# Patient Record
Sex: Male | Born: 1955
Health system: Southern US, Community
[De-identification: ages and names within clinical notes are randomized; demographics above are authoritative.]

## PROBLEM LIST (undated history)

## (undated) DIAGNOSIS — R0681 Apnea, not elsewhere classified: Secondary | ICD-10-CM

## (undated) DIAGNOSIS — R3129 Other microscopic hematuria: Secondary | ICD-10-CM

## (undated) DIAGNOSIS — E785 Hyperlipidemia, unspecified: Secondary | ICD-10-CM

## (undated) DIAGNOSIS — B029 Zoster without complications: Secondary | ICD-10-CM

## (undated) HISTORY — DX: Hyperlipidemia, unspecified: E78.5

## (undated) HISTORY — PX: HERNIA REPAIR: SHX51

## (undated) HISTORY — PX: POSTERIOR LAMINECTOMY / DECOMPRESSION LUMBAR SPINE: SUR740

## (undated) HISTORY — DX: Zoster without complications: B02.9

## (undated) HISTORY — DX: Apnea, not elsewhere classified: R06.81

## (undated) HISTORY — PX: SKIN CANCER EXCISION: SHX779

## (undated) HISTORY — DX: Other microscopic hematuria: R31.29

## (undated) HISTORY — PX: LAMINECTOMY: SHX219

---

## 2005-10-06 ENCOUNTER — Encounter: Admission: RE | Admit: 2005-10-06 | Discharge: 2005-12-01 | Payer: Self-pay | Admitting: Nurse Practitioner

## 2008-04-01 ENCOUNTER — Emergency Department (HOSPITAL_COMMUNITY): Admission: EM | Admit: 2008-04-01 | Discharge: 2008-04-01 | Payer: Self-pay | Admitting: Emergency Medicine

## 2011-10-21 ENCOUNTER — Other Ambulatory Visit: Payer: Self-pay | Admitting: Family Medicine

## 2011-10-21 ENCOUNTER — Ambulatory Visit
Admission: RE | Admit: 2011-10-21 | Discharge: 2011-10-21 | Disposition: A | Discharge status: Home / Self Care | Payer: 59 | Source: Ambulatory Visit | Attending: Family Medicine | Admitting: Family Medicine

## 2011-10-21 DIAGNOSIS — M79602 Pain in left arm: Secondary | ICD-10-CM

## 2013-08-01 ENCOUNTER — Other Ambulatory Visit: Payer: Self-pay | Admitting: Orthopedic Surgery

## 2013-08-01 DIAGNOSIS — M79641 Pain in right hand: Secondary | ICD-10-CM

## 2013-08-09 ENCOUNTER — Ambulatory Visit
Admission: RE | Admit: 2013-08-09 | Discharge: 2013-08-09 | Disposition: A | Discharge status: Home / Self Care | Payer: 59 | Source: Ambulatory Visit | Attending: Orthopedic Surgery | Admitting: Orthopedic Surgery

## 2013-08-09 DIAGNOSIS — M79641 Pain in right hand: Secondary | ICD-10-CM

## 2013-11-15 ENCOUNTER — Other Ambulatory Visit (HOSPITAL_COMMUNITY): Payer: Self-pay | Admitting: Urology

## 2013-11-15 DIAGNOSIS — N2889 Other specified disorders of kidney and ureter: Secondary | ICD-10-CM

## 2013-11-29 ENCOUNTER — Ambulatory Visit (HOSPITAL_COMMUNITY)
Admission: RE | Admit: 2013-11-29 | Discharge: 2013-11-29 | Disposition: A | Discharge status: Home / Self Care | Payer: 59 | Source: Ambulatory Visit | Attending: Urology | Admitting: Urology

## 2014-03-05 ENCOUNTER — Other Ambulatory Visit (HOSPITAL_COMMUNITY): Payer: Self-pay | Admitting: Urology

## 2014-03-05 DIAGNOSIS — R52 Pain, unspecified: Secondary | ICD-10-CM

## 2014-03-14 ENCOUNTER — Ambulatory Visit (HOSPITAL_COMMUNITY)
Admission: RE | Admit: 2014-03-14 | Discharge: 2014-03-14 | Disposition: A | Discharge status: Home / Self Care | Payer: 59 | Source: Ambulatory Visit | Attending: Urology | Admitting: Urology

## 2014-03-14 ENCOUNTER — Ambulatory Visit (HOSPITAL_COMMUNITY): Payer: 59

## 2014-03-14 DIAGNOSIS — N281 Cyst of kidney, acquired: Secondary | ICD-10-CM | POA: Diagnosis present

## 2014-03-14 DIAGNOSIS — R52 Pain, unspecified: Secondary | ICD-10-CM

## 2014-03-14 MED ORDER — GADOBENATE DIMEGLUMINE 529 MG/ML IV SOLN
20.0000 mL | Freq: Once | INTRAVENOUS | Status: AC | PRN
  Administered 2014-03-14: 20 mL via INTRAVENOUS

## 2014-07-23 ENCOUNTER — Encounter: Payer: Self-pay | Admitting: Neurology

## 2014-07-23 ENCOUNTER — Ambulatory Visit (INDEPENDENT_AMBULATORY_CARE_PROVIDER_SITE_OTHER): Payer: BLUE CROSS/BLUE SHIELD | Admitting: Neurology

## 2014-07-23 VITALS — BP 128/82 | HR 68 | Resp 18 | Ht 76.0 in | Wt 272.0 lb

## 2014-07-23 DIAGNOSIS — G4733 Obstructive sleep apnea (adult) (pediatric): Secondary | ICD-10-CM

## 2014-07-23 DIAGNOSIS — E669 Obesity, unspecified: Secondary | ICD-10-CM

## 2014-07-23 DIAGNOSIS — G4719 Other hypersomnia: Secondary | ICD-10-CM | POA: Diagnosis not present

## 2014-07-23 DIAGNOSIS — R351 Nocturia: Secondary | ICD-10-CM | POA: Diagnosis not present

## 2014-07-23 DIAGNOSIS — R519 Headache, unspecified: Secondary | ICD-10-CM

## 2014-07-23 DIAGNOSIS — R51 Headache: Secondary | ICD-10-CM

## 2014-07-23 NOTE — Progress Notes (Signed)
Subjective:    Patient ID: Jeffrey Morris is a 59 y.o. male.  HPI      Dear Dr. Link SnufferHolwerda,   I saw your patient, Jeffrey Morris, upon your kind request in my neurologic clinic today for initial consultation of his sleep disorder, in particular, concern for underlying obstructive sleep apnea. The patient is unaccompanied today. As you know, Jeffrey Morris is a 59 year old right-handed gentleman with an underlying medical history of hyperlipidemia, and obesity, who reports snoring and witnessed apneic pauses while he is asleep per wife. He does report excessive daytime somnolence. His Epworth sleepiness score is 12 out of 24 at his fatigue score is 35 today. He does report nocturia usually once per night. He has occasional morning headaches. Bedtime varies but typically is between 10 and 11 PM. His rise time is 6 to 6:30 AM. In the past couple months his symptoms have become worse. He works at the computer every day. He does not have any on call or weekend work. He drinks quite a bit of caffeine in the form of sodas about 3-4 cans per day and 2 cups of coffee. He drinks alcohol rarely. He is a nonsmoker. He denies any parasomnias. He has woken himself up with his snoring and gasping sensations while asleep. He's not aware of any obstructive sleep apnea in the family but his father snored. His father died from heart disease and stroke. Mother is 58 years old. He lives at home with his wife and 636 year old daughter.  His Past Medical History Is Significant For: Past Medical History  Diagnosis Date  . Shingles   . Microscopic hematuria   . Apneic episode   . Hyperlipemia     His Past Surgical History Is Significant For: Past Surgical History  Procedure Laterality Date  . Hernia repair    . Laminectomy    . Posterior laminectomy / decompression lumbar spine    . Skin cancer excision      His Family History Is Significant For: Family History  Problem Relation Age of Onset  . Diabetes Father    . Stroke Father   . Heart disease Father   . Cancer Paternal Aunt   . Diabetes Paternal Grandfather     His Social History Is Significant For: History   Social History  . Marital Status: Married    Spouse Name: N/A  . Number of Children: 1  . Years of Education: [employer]   Social History Main Topics  . Smoking status: Never Smoker   . Smokeless tobacco: Not on file  . Alcohol Use: 0.0 oz/week    0 Standard drinks or equivalent per week     Comment: Rare  . Drug Use: No  . Sexual Activity: Not on file   Other Topics Concern  . None   Social History Narrative   60oz of caffeine a day     His Allergies Are:  No Known Allergies:   His Current Medications Are:  Outpatient Encounter Prescriptions as of 07/23/2014  Medication Sig  . aspirin 81 MG tablet Take 81 mg by mouth daily.  Marland Kitchen. atorvastatin (LIPITOR) 10 MG tablet Take 10 mg by mouth daily.   No facility-administered encounter medications on file as of 07/23/2014.  :  Review of Systems:  Out of a complete 14 point review of systems, all are reviewed and negative with the exception of these symptoms as listed below:   Review of Systems  Neurological:       Snoring, Sometimes wakes  up during the night, Witnessed apnea, Sometimes wakes up in the morning tired, denies taking naps during the day.     Objective:  Neurologic Exam  Physical Exam Physical Examination:   Filed Vitals:   07/23/14 1350  BP: 128/82  Pulse: 68  Resp: 18   General Examination: The patient is a very pleasant 59 y.o. male in no acute distress. He appears well-developed and well-nourished and well groomed.   HEENT: Normocephalic, atraumatic, pupils are equal, round and reactive to light and accommodation. Funduscopic exam is normal with sharp disc margins noted. Extraocular tracking is good without limitation to gaze excursion or nystagmus noted. Normal smooth pursuit is noted. Hearing is grossly intact. Tympanic membranes are clear  bilaterally. Face is symmetric with normal facial animation and normal facial sensation. Speech is clear with no dysarthria noted. There is no hypophonia. There is no lip, neck/head, jaw or voice tremor. Neck is supple with full range of passive and active motion. There are no carotid bruits on auscultation. Oropharynx exam reveals: mild mouth dryness, adequate dental hygiene and mild airway crowding, due to redundant soft palate and smaller airway entry. Mallampati is class II. Tongue protrudes centrally and palate elevates symmetrically. Tonsils are small b/l. Neck size is 16.75 inches. He has a Mild overbite. Nasal inspection reveals no significant nasal mucosal bogginess or redness, but signs of recent R nosebleed and septal deviation to the R.   Chest: Clear to auscultation without wheezing, rhonchi or crackles noted.  Heart: S1+S2+0, regular and normal without murmurs, rubs or gallops noted.   Abdomen: Soft, non-tender and non-distended with normal bowel sounds appreciated on auscultation.  Extremities: There is no pitting edema in the distal lower extremities bilaterally. Pedal pulses are intact.  Skin: Warm and dry without trophic changes noted. There are no varicose veins.  Musculoskeletal: exam reveals no obvious joint deformities, tenderness or joint swelling or erythema.   Neurologically:  Mental status: The patient is awake, alert and oriented in all 4 spheres. His immediate and remote memory, attention, language skills and fund of knowledge are appropriate. There is no evidence of aphasia, agnosia, apraxia or anomia. Speech is clear with normal prosody and enunciation. Thought process is linear. Mood is normal and affect is normal.  Cranial nerves II - XII are as described above under HEENT exam. In addition: shoulder shrug is normal with equal shoulder height noted. Motor exam: Normal bulk, strength and tone is noted. There is no drift, tremor or rebound. Romberg is negative. Reflexes  are 2+ throughout. Babinski: Toes are flexor bilaterally. Fine motor skills and coordination: intact with normal finger taps, normal hand movements, normal rapid alternating patting, normal foot taps and normal foot agility.  Cerebellar testing: No dysmetria or intention tremor on finger to nose testing. Heel to shin is unremarkable bilaterally. There is no truncal or gait ataxia.  Sensory exam: intact to light touch, pinprick, vibration, temperature sense in the upper and lower extremities.  Gait, station and balance: He stands easily. No veering to one side is noted. No leaning to one side is noted. Posture is age-appropriate and stance is narrow based. Gait shows normal stride length and normal pace. No problems turning are noted. He turns en bloc. Tandem walk is unremarkable.   Assessment and Plan:  In summary, Jeffrey NipWallace Morris is a very pleasant 59 y.o.-year old male with an underlying medical history of hyperlipidemia, and obesity, whose history and physical exam are in keeping with obstructive sleep apnea (OSA). I had a  long chat with the patient about my findings and the diagnosis of OSA, its prognosis and treatment options. We talked about medical treatments, surgical interventions and non-pharmacological approaches. I explained in particular the risks and ramifications of untreated moderate to severe OSA, especially with respect to developing cardiovascular disease down the Road, including congestive heart failure, difficult to treat hypertension, cardiac arrhythmias, or stroke. Even type 2 diabetes has, in part, been linked to untreated OSA. Symptoms of untreated OSA include daytime sleepiness, memory problems, mood irritability and mood disorder such as depression and anxiety, lack of energy, as well as recurrent headaches, especially morning headaches. We talked about trying to maintain a healthy lifestyle in general, as well as the importance of weight control. I encouraged the patient to eat  healthy, exercise daily and keep well hydrated, to keep a scheduled bedtime and wake time routine, to not skip any meals and eat healthy snacks in between meals. I advised the patient not to drive when feeling sleepy. He is advised to try to reduce his caffeine intake.  I recommended the following at this time: sleep study with potential positive airway pressure titration. (We will score hypopneas at 3% and split the sleep study into diagnostic and treatment portion, if the estimated. 2 hour AHI is >15/h).   I explained the sleep test procedure to the patient and also outlined possible surgical and non-surgical treatment options of OSA, including the use of a custom-made dental device (which would require a referral to a specialist dentist or oral surgeon), upper airway surgical options, such as pillar implants, radiofrequency surgery, tongue base surgery, and UPPP (which would involve a referral to an ENT surgeon). Rarely, jaw surgery such as mandibular advancement may be considered.  I also explained the CPAP treatment option to the patient, who indicated that he would be willing to try CPAP if the need arises. I explained the importance of being compliant with PAP treatment, not only for insurance purposes but primarily to improve His symptoms, and for the patient's long term health benefit, including to reduce His cardiovascular risks. I answered all his questions today and the patient was in agreement. I would like to see him back after the sleep study is completed and encouraged him to call with any interim questions, concerns, problems or updates.   Thank you very much for allowing me to participate in the care of this nice patient. If I can be of any further assistance to you please do not hesitate to call me at [phone].  Sincerely,   Huston Foley, MD, PhD  .

## 2014-07-23 NOTE — Patient Instructions (Addendum)

## 2014-08-14 ENCOUNTER — Ambulatory Visit (INDEPENDENT_AMBULATORY_CARE_PROVIDER_SITE_OTHER): Payer: BLUE CROSS/BLUE SHIELD | Admitting: Neurology

## 2014-08-14 VITALS — BP 137/81

## 2014-08-14 DIAGNOSIS — G4733 Obstructive sleep apnea (adult) (pediatric): Secondary | ICD-10-CM | POA: Diagnosis not present

## 2014-08-14 DIAGNOSIS — G4730 Sleep apnea, unspecified: Secondary | ICD-10-CM

## 2014-08-14 DIAGNOSIS — G479 Sleep disorder, unspecified: Secondary | ICD-10-CM

## 2014-08-14 DIAGNOSIS — G4710 Hypersomnia, unspecified: Secondary | ICD-10-CM

## 2014-08-14 DIAGNOSIS — G4761 Periodic limb movement disorder: Secondary | ICD-10-CM

## 2014-08-14 NOTE — Sleep Study (Signed)
Please see the scanned sleep study interpretation located in the Procedure tab within the Chart Review section. 

## 2014-08-20 ENCOUNTER — Telehealth: Payer: Self-pay | Admitting: Neurology

## 2014-08-20 DIAGNOSIS — G4733 Obstructive sleep apnea (adult) (pediatric): Secondary | ICD-10-CM

## 2014-08-20 NOTE — Telephone Encounter (Signed)
Patient seen on 07/23/14, split night sleep study on 08/14/14.  Ins: [employer] [place]:   Please call and notify patient that the recent sleep study confirmed the diagnosis of moderate OSA. He did well with CPAP during the study with significant improvement of the respiratory events. Therefore, I would like start the patient on CPAP therapy at home by prescribing a machine for home use. I placed the order in the chart. The patient will need a follow up appointment with me in 8 to 10 weeks post set up that has to be scheduled; please go ahead and schedule while you have the patient on the phone and make sure patient understands the importance of keeping this window for the FU appointment, as it is often an insurance requirement and failing to adhere to this may result in losing coverage for sleep apnea treatment. 15 min follow-up should suffice, unless there is a 30 min FU slot available.  Please re-enforce the importance of compliance with treatment and the need for us to monitor compliance data - again an insurance requirement and good feedback for the patient as far as how they are doing.  Also remind patient, that any upcoming CPAP machine or mask issues, should be first addressed with the DME company. Please ask if patient has a preference regarding DME company.  Please arrange for CPAP set up at home through a DME company of patient's choice - once you have spoken to the patient - and faxed/routed report to PCP and referring MD (if other than PCP), you can close this encounter, thanks,   Huston FoleySaima Loghan Kurtzman, MD, PhD [facility] ([facility])

## 2014-08-21 NOTE — Telephone Encounter (Signed)
Left message to call back for sleep study results.  

## 2014-08-21 NOTE — Telephone Encounter (Signed)
Faxed to PCP today.

## 2014-08-24 NOTE — Telephone Encounter (Signed)
Left message to call us back for sleep study results.

## 2014-09-03 NOTE — Telephone Encounter (Signed)
I spoke to patient today. He is aware of PSG results and would like to start CPAP at home. He has Acupuncturist and lives in [place]. He was not able to make f/u appt today but will call back. I will send patient a letter to remind him to make appt and notify him of the importance of compliance.

## 2014-09-05 NOTE — Telephone Encounter (Signed)
Referral has been sent to [facility] in [place].

## 2014-11-21 ENCOUNTER — Ambulatory Visit (INDEPENDENT_AMBULATORY_CARE_PROVIDER_SITE_OTHER): Payer: BLUE CROSS/BLUE SHIELD | Admitting: Neurology

## 2014-11-21 ENCOUNTER — Encounter: Payer: Self-pay | Admitting: Neurology

## 2014-11-21 VITALS — BP 118/72 | HR 62 | Resp 18 | Ht 76.0 in | Wt 277.0 lb

## 2014-11-21 DIAGNOSIS — G4733 Obstructive sleep apnea (adult) (pediatric): Secondary | ICD-10-CM | POA: Diagnosis not present

## 2014-11-21 DIAGNOSIS — Z9989 Dependence on other enabling machines and devices: Principal | ICD-10-CM

## 2014-11-21 NOTE — Progress Notes (Signed)
Subjective:    Patient ID: Jeffrey Morris is a 59 y.o. male.  HPI     Interim history:   Jeffrey Morris is a 59 year old right-handed gentleman with an underlying medical history of hyperlipidemia, and obesity, who presents for follow-up consultation of his obstructive sleep apnea, after his sleep study. The patient is unaccompanied today. I first met him on 07/23/2014 at the request of his primary care physician, at which time the patient reported snoring and witnessed apneic pauses while asleep per wife. I invited him back for sleep study. He had a split-night sleep study on 08/14/2014 and went over his test results with him in detail today. His baseline sleep efficiency was 50.1% with a latency to sleep of 82.5 minutes and wake after sleep onset of 29.5 minutes with mild sleep fragmentation noted. He had an elevated arousal index. He had an increased percentage of stage II sleep, absence of slow-wave sleep and REM sleep at 15.6% with a normal REM latency. He had no significant EKG, or EEG changes. He had no significant PLMS. He had mild to moderate snoring. He had 51 obstructive hypopneas. His AHI was 27.2 per hour, baseline oxygen saturation 91%, nadir was 84%. He was then titrated with CPAP. Sleep efficiency was 81.7%. Latency to sleep of 25.5 minutes and wake after sleep onset of 14.5 minutes with mild sleep fragmentation noted. He had a normal arousal index. He had an increased percentage of REM sleep at 38.3%. Average oxygen saturation was 93%, nadir was 89%. Moderate PLMS were noted at 47.2 per hour with an associated arousal index of only 1.3 per hour. He was titrated from 5 cm to 11 cm. On the final pressure his AHI was 0 per hour with supine REM sleep achieved. Based on the test results are prescribed CPAP therapy for home use at a pressure of 11 cm.  Today, 11/21/2014: I reviewed his CPAP compliance data from 10/21/2014 through 11/19/2014 which is a total of 30 days during which time he used his  machine 29 days with percent used days greater than 4 hours at 80%, indicating very good compliance with an average usage of 5 hours and 13 minutes, residual AHI low at 0.9 per hour, leak at times high with the 95th percentile at 30.9 L/m on a pressure of 11 with EPR of 2.  Today, 11/21/2014: He reports feeling about the same, but nocturia may be a little better. His snoring has definitely improved and his daytime somnolence is a little better. He does not necessarily allow for enough sleep time as he averages about 5-5 1/2 hours of sleep on an average night. He is tolerating the pressure in the mask. He has noticed air leaking from the mask. He likes to sleep on his sides. He is using a fullface mask because of mouth opening and he could not use a nasal mask as originally prescribed. He has no new complaints.  Previously:  07/23/2014: He does report excessive daytime somnolence. His Epworth sleepiness score is 12 out of 24 at his fatigue score is 35 today. He does report nocturia usually once per night. He has occasional morning headaches. Bedtime varies but typically is between 10 and 11 PM. His rise time is 6 to 6:30 AM. In the past couple months his symptoms have become worse. He works at the computer every day. He does not have any on call or weekend work. He drinks quite a bit of caffeine in the form of sodas about 3-4 cans per  day and 2 cups of coffee. He drinks alcohol rarely. He is a nonsmoker. He denies any parasomnias. He has woken himself up with his snoring and gasping sensations while asleep. He's not aware of any obstructive sleep apnea in the family but his father snored. His father died from heart disease and stroke. Mother is 68 years old. He lives at home with his wife and 2 year old daughter.  His Past Medical History Is Significant For: Past Medical History  Diagnosis Date  . Shingles   . Microscopic hematuria   . Apneic episode   . Hyperlipemia     His Past Surgical History Is  Significant For: Past Surgical History  Procedure Laterality Date  . Hernia repair    . Laminectomy    . Posterior laminectomy / decompression lumbar spine    . Skin cancer excision      His Family History Is Significant For: Family History  Problem Relation Age of Onset  . Diabetes Father   . Stroke Father   . Heart disease Father   . Cancer Paternal Aunt   . Diabetes Paternal Grandfather     His Social History Is Significant For: Social History   Social History  . Marital Status: Married    Spouse Name: N/A  . Number of Children: 1  . Years of Education: [employer]   Social History Main Topics  . Smoking status: Never Smoker   . Smokeless tobacco: None  . Alcohol Use: 0.0 oz/week    0 Standard drinks or equivalent per week     Comment: Rare  . Drug Use: No  . Sexual Activity: Not Asked   Other Topics Concern  . None   Social History Narrative   60oz of caffeine a day     His Allergies Are:  No Known Allergies:   His Current Medications Are:  Outpatient Encounter Prescriptions as of 11/21/2014  Medication Sig  . aspirin 81 MG tablet Take 81 mg by mouth daily.  Marland Kitchen atorvastatin (LIPITOR) 10 MG tablet Take 10 mg by mouth daily.   No facility-administered encounter medications on file as of 11/21/2014.  :  Review of Systems:  Out of a complete 14 point review of systems, all are reviewed and negative with the exception of these symptoms as listed below:  Review of Systems  Neurological:       Patient feels that he is doing well with CPAP machine. Reports some trouble with sleeping on his side and breaking the seal.     Objective:  Neurologic Exam  Physical Exam Physical Examination:   Filed Vitals:   11/21/14 1614  BP: 118/72  Pulse: 62  Resp: 18   General Examination: The patient is a very pleasant 59 y.o. male in no acute distress. He appears well-developed and well-nourished and well groomed.   HEENT: Normocephalic, atraumatic, pupils are equal,  round and reactive to light and accommodation. Funduscopic exam is normal with sharp disc margins noted. Extraocular tracking is good without limitation to gaze excursion or nystagmus noted. Normal smooth pursuit is noted. Hearing is grossly intact. Face is symmetric with normal facial animation and normal facial sensation. Speech is clear with no dysarthria noted. There is no hypophonia. There is no lip, neck/head, jaw or voice tremor. Neck is supple with full range of passive and active motion. There are no carotid bruits on auscultation. Oropharynx exam reveals: mild mouth dryness, adequate dental hygiene and mild airway crowding, due to redundant soft palate and smaller airway entry.  Mallampati is class II. Tongue protrudes centrally and palate elevates symmetrically. Tonsils are small b/l.   Chest: Clear to auscultation without wheezing, rhonchi or crackles noted.  Heart: S1+S2+0, regular and normal without murmurs, rubs or gallops noted.   Abdomen: Soft, non-tender and non-distended with normal bowel sounds appreciated on auscultation.  Extremities: There is no pitting edema in the distal lower extremities bilaterally. Pedal pulses are intact.  Skin: Warm and dry without trophic changes noted. There are no varicose veins.  Musculoskeletal: exam reveals no obvious joint deformities, tenderness or joint swelling or erythema.   Neurologically:  Mental status: The patient is awake, alert and oriented in all 4 spheres. His immediate and remote memory, attention, language skills and fund of knowledge are appropriate. There is no evidence of aphasia, agnosia, apraxia or anomia. Speech is clear with normal prosody and enunciation. Thought process is linear. Mood is normal and affect is normal.  Cranial nerves II - XII are as described above under HEENT exam. In addition: shoulder shrug is normal with equal shoulder height noted. Motor exam: Normal bulk, strength and tone is noted. There is no drift,  tremor or rebound. Romberg is negative. Reflexes are 2+ throughout. Fine motor skills and coordination: intact with normal finger taps, normal hand movements, normal rapid alternating patting, normal foot taps and normal foot agility.  Cerebellar testing: No dysmetria or intention tremor on finger to nose testing. Heel to shin is unremarkable bilaterally. There is no truncal or gait ataxia.  Sensory exam: intact to light touch in the upper and lower extremities.  Gait, station and balance: He stands easily. No veering to one side is noted. No leaning to one side is noted. Posture is age-appropriate and stance is narrow based. Gait shows normal stride length and normal pace. No problems turning are noted. He turns en bloc. Tandem walk is unremarkable.   Assessment and Plan:  In summary, Majd Tissue is a very pleasant 59 year old male with an underlying medical history of hyperlipidemia, and obesity, who presents for follow-up consultation of his recent diagnosis of obstructive sleep apnea, now established on CPAP therapy. He had a split-night sleep study on 08/14/2014 which talked about his test results in detail today as well as his compliance data. He is commended for his treatment adherence. He reports slight improvements in his sleep including improvement in his nocturia but is reminded to allow for more sleep time. In addition, he drinks a lot of caffeine and his last caffeine containing beverage is as late as 10 or 11 PM at times. He is advised to increase his water intake and reduce his caffeine intake. I advised him regarding good sleep hygiene. I again explained the diagnosis of OSA, its prognosis and treatment options. We talked about medical treatments, surgical interventions and non-pharmacological approaches. I explained in particular the risks and ramifications of untreated moderate to severe OSA, especially with respect to developing cardiovascular disease down the Road, including congestive  heart failure, difficult to treat hypertension, cardiac arrhythmias, or stroke. Even type 2 diabetes has, in part, been linked to untreated OSA. Symptoms of untreated OSA include daytime sleepiness, memory problems, mood irritability and mood disorder such as depression and anxiety, lack of energy, as well as recurrent headaches, especially morning headaches.  We talked about trying to maintain a healthy lifestyle in general, as well as the importance of weight control. I encouraged the patient to continue to be compliant with CPAP therapy, not only for insurance purposes but primarily to  improve His symptoms, and for the patient's long term health benefit, including to reduce His cardiovascular risks. I would like to see him back in 6 months, sooner if the need arises. His physical exam is stable. I answered all his questions today and the patient was in agreement. I spent 20 minutes in total face-to-face time with the patient, more than 50% of which was spent in counseling and coordination of care, reviewing test results, reviewing medication and discussing or reviewing the diagnosis of OSA, its prognosis and treatment options.

## 2014-11-21 NOTE — Patient Instructions (Signed)
Please continue using your CPAP regularly. While your insurance requires that you use CPAP at least 4 hours each night on 70% of the nights, I recommend, that you not skip any nights and use it throughout the night if you can. Getting used to CPAP and staying with the treatment long term does take time and patience and discipline. Untreated obstructive sleep apnea when it is moderate to severe can have an adverse impact on cardiovascular health and raise her risk for heart disease, arrhythmias, hypertension, congestive heart failure, stroke and diabetes. Untreated obstructive sleep apnea causes sleep disruption, nonrestorative sleep, and sleep deprivation. This can have an impact on your day to day functioning and cause daytime sleepiness and impairment of cognitive function, memory loss, mood disturbance, and problems focussing. Using CPAP regularly can improve these symptoms. Keep up the good work! I will see you back in 6 months for sleep apnea check up, and if you continue to do well on CPAP I will see you once a year thereafter.   Drink more water, less caffeine.   Please remember to try to maintain good sleep hygiene, which means: Keep a regular sleep and wake schedule, try not to exercise or have a meal within 2 hours of your bedtime, try to keep your bedroom conducive for sleep, that is, cool and dark, without light distractors such as an illuminated alarm clock, and refrain from watching TV right before sleep or in the middle of the night and do not keep the TV or radio on during the night. Also, try not to use or play on electronic devices at bedtime, such as your cell phone, tablet PC or laptop. If you like to read at bedtime on an electronic device, try to dim the background light as much as possible. Do not eat in the middle of the night.

## 2015-05-22 ENCOUNTER — Ambulatory Visit: Payer: BLUE CROSS/BLUE SHIELD | Admitting: Neurology

## 2015-12-03 ENCOUNTER — Ambulatory Visit (INDEPENDENT_AMBULATORY_CARE_PROVIDER_SITE_OTHER): Payer: Managed Care, Other (non HMO)

## 2015-12-03 ENCOUNTER — Ambulatory Visit (INDEPENDENT_AMBULATORY_CARE_PROVIDER_SITE_OTHER): Payer: Managed Care, Other (non HMO) | Admitting: Podiatry

## 2015-12-03 ENCOUNTER — Encounter: Payer: Self-pay | Admitting: Podiatry

## 2015-12-03 VITALS — BP 155/94 | HR 58 | Resp 16

## 2015-12-03 DIAGNOSIS — M79672 Pain in left foot: Secondary | ICD-10-CM | POA: Diagnosis not present

## 2015-12-03 DIAGNOSIS — M7752 Other enthesopathy of left foot: Secondary | ICD-10-CM

## 2015-12-03 DIAGNOSIS — M778 Other enthesopathies, not elsewhere classified: Secondary | ICD-10-CM

## 2015-12-03 DIAGNOSIS — M779 Enthesopathy, unspecified: Principal | ICD-10-CM

## 2015-12-03 MED ORDER — METHYLPREDNISOLONE 4 MG PO TBPK: ORAL_TABLET | ORAL | 0 refills | Status: AC

## 2015-12-03 MED ORDER — MELOXICAM 15 MG PO TABS: 15.0000 mg | ORAL_TABLET | Freq: Every day | ORAL | 3 refills | Status: AC

## 2015-12-03 NOTE — Progress Notes (Signed)
   Subjective:    Patient ID: Jeffrey Morris, male    DOB: 08/31/1955, 60 y.o.   MRN: [id number]  HPI: He presents today with a chief complaint of a painful forefoot left. He states that is beneath the second and third toes. He states it has been aching now for the past 2-3 months with burning in the toes at times. He states that it feels swollen and like there is something underneath his toes that he is walking on. He states that he seems to be worsening rather than improving. He denies any trauma.    Review of Systems  All other systems reviewed and are negative.      Objective:   Physical Exam: Vital signs are stable alert and oriented 3. Pulses are palpable. Neurologic sensorium is intact per Semmes-Weinstein monofilament. Deep tendon reflexes are intact. Muscle strength is 5 over 5 dorsiflexion plantar flexors and inverters everters all intrinsic musculature is intact. Orthopedic evaluation does demonstrates pain on end range of motion of the second and third digits of the left foot and some mild swelling about the second and third metatarsophalangeal joints. Radiographs do not demonstrate any type of osseous abnormalities this area. Otherwise the radiographs do not demonstrate any signs of fractures or any abnormalities. Cutaneous evaluation demonstrates supple well-hydrated cutis other than the local edema at the area of the second and third metatarsophalangeal joints cutaneous evaluation is unremarkable.        Assessment & Plan:  Assessment: Capsulitis second and third metatarsophalangeal joints.  Plan: I injected the area between the second third metatarsophalangeal joints today with Kenalog and local anesthetic discussed appropriate shoe gear stretching exercises and ice therapy. Placed him on a Medrol Dosepak to be followed by meloxicam. And I did discuss stiff soled shoes with him. I will follow-up with him in 1 month.

## 2016-01-02 ENCOUNTER — Ambulatory Visit: Payer: Managed Care, Other (non HMO) | Admitting: Podiatry

## 2017-01-21 ENCOUNTER — Ambulatory Visit: Payer: Managed Care, Other (non HMO)

## 2017-01-21 ENCOUNTER — Encounter: Payer: Self-pay | Admitting: Podiatry

## 2017-01-21 ENCOUNTER — Ambulatory Visit (INDEPENDENT_AMBULATORY_CARE_PROVIDER_SITE_OTHER): Payer: Managed Care, Other (non HMO) | Admitting: Podiatry

## 2017-01-21 DIAGNOSIS — M7752 Other enthesopathy of left foot: Secondary | ICD-10-CM | POA: Diagnosis not present

## 2017-01-21 DIAGNOSIS — M778 Other enthesopathies, not elsewhere classified: Secondary | ICD-10-CM

## 2017-01-21 DIAGNOSIS — M779 Enthesopathy, unspecified: Principal | ICD-10-CM

## 2017-01-21 NOTE — Progress Notes (Signed)
He presents today after a year with a chief complaint of pain to the second and third metatarsophalangeal joint of the left foot. He states that it has become more painful over the past several months. He states that he started to develop burning and numbness beneath the area with a full feeling sensation motorcycle wadded up sock beneath the joints.  Objective: Vital signs are stable he is alert and oriented 3. His pulses are strong palpable. It is easy to see that he has contracture deformities that are mild and flexible at the level of the second and third metatarsophalangeal joints but this is resulting in plantarflexion of these metatarsals and reactive capsulitis. He has pain in range of motion of the second and third metatarsophalangeal joints.  Assessment: Capsulitis of the second and third metatarsophalangeal joints of the left foot with mild hammertoe deformities. He is also developed some neuritis associated with this area.  Plan: Discussed etiology pathology conservative versus surgical therapies. At this point I would like Jeffrey Morris to schedule him for orthotic casting to offload the forefoot with metatarsal pad. Hopefully he will also have a cutout beneath the second and third metatarsophalangeal joints of the left foot. I will follow up with him for an injection once this is then performed and he receives his orthotics.

## 2017-02-11 ENCOUNTER — Ambulatory Visit (INDEPENDENT_AMBULATORY_CARE_PROVIDER_SITE_OTHER): Payer: Managed Care, Other (non HMO) | Admitting: Podiatry

## 2017-02-11 DIAGNOSIS — M7750 Other enthesopathy of unspecified foot: Secondary | ICD-10-CM | POA: Diagnosis not present

## 2017-02-11 DIAGNOSIS — M778 Other enthesopathies, not elsewhere classified: Secondary | ICD-10-CM

## 2017-02-11 DIAGNOSIS — M7752 Other enthesopathy of left foot: Secondary | ICD-10-CM | POA: Diagnosis not present

## 2017-02-11 DIAGNOSIS — M779 Enthesopathy, unspecified: Principal | ICD-10-CM

## 2017-02-11 NOTE — Progress Notes (Signed)
Patient came in today to pick up custom made foot orthotics.  The goals were accomplished and the patient reported no dissatisfaction with said orthotics.  Patient was advised of breakin period and how to report any issues. 

## 2017-02-19 ENCOUNTER — Telehealth: Payer: Self-pay | Admitting: Podiatry

## 2017-02-19 NOTE — Telephone Encounter (Signed)
Pt left voicemail 12.6.18 @ 354pm about orthotics not working for him.    I returned call and had to leave a message for pt to call my direct number and I can get him scheduled to see Raiford NobleRick.

## 2017-03-18 ENCOUNTER — Telehealth: Payer: Self-pay | Admitting: Podiatry

## 2017-03-18 NOTE — Telephone Encounter (Signed)
I have left 3 messages for pt that appt for 1.4.19 has to be rescheduled due to [place] out of office.

## 2017-03-19 ENCOUNTER — Other Ambulatory Visit: Payer: Managed Care, Other (non HMO) | Admitting: Orthotics

## 2017-03-22 ENCOUNTER — Ambulatory Visit: Payer: Managed Care, Other (non HMO) | Admitting: Orthotics

## 2017-03-22 DIAGNOSIS — M779 Enthesopathy, unspecified: Principal | ICD-10-CM

## 2017-03-22 DIAGNOSIS — M778 Other enthesopathies, not elsewhere classified: Secondary | ICD-10-CM

## 2017-03-22 NOTE — Progress Notes (Signed)
Adjust foot orthotics RIGHT to make foot feel better.taking out met pad.

## 2017-04-05 ENCOUNTER — Other Ambulatory Visit: Payer: Managed Care, Other (non HMO) | Admitting: Orthotics

## 2017-08-11 ENCOUNTER — Telehealth: Payer: Self-pay | Admitting: Neurology

## 2017-08-11 NOTE — Telephone Encounter (Addendum)
Pts wife called stating that [employer] hadn't reached out for 2 years, stating the pt still using the same hose and mask from 2 years ago. Lupita Leash requesting a call back to discuss getting the pt new supplies as well as switching to [facility]. Lupita Leash also advised pt will need an appt.  Please call to advise

## 2017-08-11 NOTE — Telephone Encounter (Addendum)
We have not seen this pt since 2016. Unfortunately, we will not be unable to provide any orders, including cpap supplies or transfer of care orders, until the pt is seen in the office. We require at least yearly appts for any orders to be generated.  Please call the pt's wife and explain that pt will need an appt.

## 2017-08-11 NOTE — Telephone Encounter (Signed)
Spoke with pts wife Lupita Leash, advised her per the RN for Dr. Frances Furbish to order any supplies the pt would have top schedule an appt since he hadn't been seen since 2016. Lupita Leash stated she "didn't understand that policy since I have gotten my supplies through [facility] without seeing the Dr who did my sleep study for 7 years ". Lupita Leash stated she would inform the pt and hung up.

## 2017-10-25 ENCOUNTER — Encounter

## 2017-10-25 ENCOUNTER — Ambulatory Visit: Payer: Managed Care, Other (non HMO) | Admitting: Neurology

## 2018-06-05 DIAGNOSIS — Z1212 Encounter for screening for malignant neoplasm of rectum: Secondary | ICD-10-CM | POA: Diagnosis not present

## 2018-06-05 DIAGNOSIS — Z1211 Encounter for screening for malignant neoplasm of colon: Secondary | ICD-10-CM | POA: Diagnosis not present

## 2019-02-23 DIAGNOSIS — E7849 Other hyperlipidemia: Secondary | ICD-10-CM | POA: Diagnosis not present

## 2019-02-23 DIAGNOSIS — Z125 Encounter for screening for malignant neoplasm of prostate: Secondary | ICD-10-CM | POA: Diagnosis not present

## 2019-03-01 DIAGNOSIS — R82998 Other abnormal findings in urine: Secondary | ICD-10-CM | POA: Diagnosis not present

## 2019-03-03 DIAGNOSIS — Z0000 Encounter for general adult medical examination without abnormal findings: Secondary | ICD-10-CM | POA: Diagnosis not present

## 2019-03-03 DIAGNOSIS — E785 Hyperlipidemia, unspecified: Secondary | ICD-10-CM | POA: Diagnosis not present

## 2019-03-03 DIAGNOSIS — Z1331 Encounter for screening for depression: Secondary | ICD-10-CM | POA: Diagnosis not present

## 2019-03-03 DIAGNOSIS — N529 Male erectile dysfunction, unspecified: Secondary | ICD-10-CM | POA: Diagnosis not present

## 2019-03-08 DIAGNOSIS — Z1212 Encounter for screening for malignant neoplasm of rectum: Secondary | ICD-10-CM | POA: Diagnosis not present

## 2019-11-29 ENCOUNTER — Other Ambulatory Visit: Payer: Self-pay

## 2019-11-29 DIAGNOSIS — Z20822 Contact with and (suspected) exposure to covid-19: Secondary | ICD-10-CM

## 2019-12-02 LAB — NOVEL CORONAVIRUS, NAA: SARS-CoV-2, NAA: DETECTED — AB

## 2019-12-28 DIAGNOSIS — L918 Other hypertrophic disorders of the skin: Secondary | ICD-10-CM | POA: Diagnosis not present

## 2020-01-16 DIAGNOSIS — H40013 Open angle with borderline findings, low risk, bilateral: Secondary | ICD-10-CM | POA: Diagnosis not present

## 2020-03-04 DIAGNOSIS — G4733 Obstructive sleep apnea (adult) (pediatric): Secondary | ICD-10-CM | POA: Diagnosis not present

## 2020-03-18 DIAGNOSIS — Z125 Encounter for screening for malignant neoplasm of prostate: Secondary | ICD-10-CM | POA: Diagnosis not present

## 2020-03-18 DIAGNOSIS — E785 Hyperlipidemia, unspecified: Secondary | ICD-10-CM | POA: Diagnosis not present

## 2020-03-25 DIAGNOSIS — Z1339 Encounter for screening examination for other mental health and behavioral disorders: Secondary | ICD-10-CM | POA: Diagnosis not present

## 2020-03-25 DIAGNOSIS — R82998 Other abnormal findings in urine: Secondary | ICD-10-CM | POA: Diagnosis not present

## 2020-03-25 DIAGNOSIS — Z1331 Encounter for screening for depression: Secondary | ICD-10-CM | POA: Diagnosis not present

## 2020-03-25 DIAGNOSIS — Z0000 Encounter for general adult medical examination without abnormal findings: Secondary | ICD-10-CM | POA: Diagnosis not present

## 2020-03-25 DIAGNOSIS — Z23 Encounter for immunization: Secondary | ICD-10-CM | POA: Diagnosis not present

## 2020-04-02 DIAGNOSIS — Z1212 Encounter for screening for malignant neoplasm of rectum: Secondary | ICD-10-CM | POA: Diagnosis not present

## 2020-06-21 DIAGNOSIS — M67911 Unspecified disorder of synovium and tendon, right shoulder: Secondary | ICD-10-CM | POA: Diagnosis not present

## 2020-06-21 DIAGNOSIS — M19011 Primary osteoarthritis, right shoulder: Secondary | ICD-10-CM | POA: Diagnosis not present

## 2020-06-24 ENCOUNTER — Other Ambulatory Visit: Payer: Self-pay | Admitting: Orthopedic Surgery

## 2020-06-24 DIAGNOSIS — M67911 Unspecified disorder of synovium and tendon, right shoulder: Secondary | ICD-10-CM

## 2020-06-24 DIAGNOSIS — M19011 Primary osteoarthritis, right shoulder: Secondary | ICD-10-CM

## 2020-07-20 ENCOUNTER — Other Ambulatory Visit: Payer: Self-pay

## 2020-07-20 ENCOUNTER — Ambulatory Visit
Admission: RE | Admit: 2020-07-20 | Discharge: 2020-07-20 | Disposition: A | Discharge status: Home / Self Care | Payer: BC Managed Care – PPO | Source: Ambulatory Visit | Attending: Orthopedic Surgery | Admitting: Orthopedic Surgery

## 2020-07-20 DIAGNOSIS — M19011 Primary osteoarthritis, right shoulder: Secondary | ICD-10-CM

## 2020-07-20 DIAGNOSIS — M67912 Unspecified disorder of synovium and tendon, left shoulder: Secondary | ICD-10-CM

## 2020-07-20 DIAGNOSIS — M25511 Pain in right shoulder: Secondary | ICD-10-CM | POA: Diagnosis not present

## 2020-07-26 DIAGNOSIS — M75111 Incomplete rotator cuff tear or rupture of right shoulder, not specified as traumatic: Secondary | ICD-10-CM | POA: Diagnosis not present

## 2020-07-26 DIAGNOSIS — M19011 Primary osteoarthritis, right shoulder: Secondary | ICD-10-CM | POA: Diagnosis not present

## 2020-08-06 DIAGNOSIS — M75111 Incomplete rotator cuff tear or rupture of right shoulder, not specified as traumatic: Secondary | ICD-10-CM | POA: Diagnosis not present

## 2020-08-06 DIAGNOSIS — M19011 Primary osteoarthritis, right shoulder: Secondary | ICD-10-CM | POA: Diagnosis not present

## 2020-08-06 DIAGNOSIS — M25611 Stiffness of right shoulder, not elsewhere classified: Secondary | ICD-10-CM | POA: Diagnosis not present

## 2021-01-30 DIAGNOSIS — Z23 Encounter for immunization: Secondary | ICD-10-CM | POA: Diagnosis not present

## 2021-02-27 DIAGNOSIS — R1310 Dysphagia, unspecified: Secondary | ICD-10-CM | POA: Diagnosis not present

## 2021-02-27 DIAGNOSIS — R1013 Epigastric pain: Secondary | ICD-10-CM | POA: Diagnosis not present

## 2021-02-27 DIAGNOSIS — R140 Abdominal distension (gaseous): Secondary | ICD-10-CM | POA: Diagnosis not present

## 2021-02-27 DIAGNOSIS — K219 Gastro-esophageal reflux disease without esophagitis: Secondary | ICD-10-CM | POA: Diagnosis not present

## 2021-03-27 DIAGNOSIS — R140 Abdominal distension (gaseous): Secondary | ICD-10-CM | POA: Diagnosis not present

## 2021-03-27 DIAGNOSIS — R1013 Epigastric pain: Secondary | ICD-10-CM | POA: Diagnosis not present

## 2021-04-01 DIAGNOSIS — Z125 Encounter for screening for malignant neoplasm of prostate: Secondary | ICD-10-CM | POA: Diagnosis not present

## 2021-04-01 DIAGNOSIS — E785 Hyperlipidemia, unspecified: Secondary | ICD-10-CM | POA: Diagnosis not present

## 2021-04-08 DIAGNOSIS — Z1331 Encounter for screening for depression: Secondary | ICD-10-CM | POA: Diagnosis not present

## 2021-04-08 DIAGNOSIS — Z0000 Encounter for general adult medical examination without abnormal findings: Secondary | ICD-10-CM | POA: Diagnosis not present

## 2021-04-08 DIAGNOSIS — Z1339 Encounter for screening examination for other mental health and behavioral disorders: Secondary | ICD-10-CM | POA: Diagnosis not present

## 2021-04-08 DIAGNOSIS — G4733 Obstructive sleep apnea (adult) (pediatric): Secondary | ICD-10-CM | POA: Diagnosis not present

## 2021-04-11 DIAGNOSIS — K219 Gastro-esophageal reflux disease without esophagitis: Secondary | ICD-10-CM | POA: Diagnosis not present

## 2021-04-11 DIAGNOSIS — R1310 Dysphagia, unspecified: Secondary | ICD-10-CM | POA: Diagnosis not present

## 2021-07-24 DIAGNOSIS — T7840XA Allergy, unspecified, initial encounter: Secondary | ICD-10-CM | POA: Diagnosis not present

## 2021-08-05 DIAGNOSIS — Z1212 Encounter for screening for malignant neoplasm of rectum: Secondary | ICD-10-CM | POA: Diagnosis not present

## 2021-08-05 DIAGNOSIS — Z1211 Encounter for screening for malignant neoplasm of colon: Secondary | ICD-10-CM | POA: Diagnosis not present

## 2021-09-25 DIAGNOSIS — G4733 Obstructive sleep apnea (adult) (pediatric): Secondary | ICD-10-CM | POA: Diagnosis not present

## 2021-10-02 DIAGNOSIS — Z9989 Dependence on other enabling machines and devices: Secondary | ICD-10-CM | POA: Diagnosis not present

## 2021-10-02 DIAGNOSIS — G4733 Obstructive sleep apnea (adult) (pediatric): Secondary | ICD-10-CM | POA: Diagnosis not present

## 2021-10-02 DIAGNOSIS — R634 Abnormal weight loss: Secondary | ICD-10-CM | POA: Diagnosis not present

## 2021-12-10 DIAGNOSIS — G4733 Obstructive sleep apnea (adult) (pediatric): Secondary | ICD-10-CM | POA: Diagnosis not present

## 2022-03-19 DIAGNOSIS — L539 Erythematous condition, unspecified: Secondary | ICD-10-CM | POA: Diagnosis not present

## 2022-03-19 DIAGNOSIS — G4733 Obstructive sleep apnea (adult) (pediatric): Secondary | ICD-10-CM | POA: Diagnosis not present

## 2022-04-21 DIAGNOSIS — E785 Hyperlipidemia, unspecified: Secondary | ICD-10-CM | POA: Diagnosis not present

## 2022-04-21 DIAGNOSIS — Z125 Encounter for screening for malignant neoplasm of prostate: Secondary | ICD-10-CM | POA: Diagnosis not present

## 2022-05-11 DIAGNOSIS — M25511 Pain in right shoulder: Secondary | ICD-10-CM | POA: Diagnosis not present

## 2022-05-11 DIAGNOSIS — Z1331 Encounter for screening for depression: Secondary | ICD-10-CM | POA: Diagnosis not present

## 2022-05-11 DIAGNOSIS — Z0000 Encounter for general adult medical examination without abnormal findings: Secondary | ICD-10-CM | POA: Diagnosis not present

## 2022-05-11 DIAGNOSIS — Z1339 Encounter for screening examination for other mental health and behavioral disorders: Secondary | ICD-10-CM | POA: Diagnosis not present

## 2022-05-11 DIAGNOSIS — R972 Elevated prostate specific antigen [PSA]: Secondary | ICD-10-CM | POA: Diagnosis not present

## 2022-05-11 DIAGNOSIS — G4733 Obstructive sleep apnea (adult) (pediatric): Secondary | ICD-10-CM | POA: Diagnosis not present

## 2022-05-11 DIAGNOSIS — E785 Hyperlipidemia, unspecified: Secondary | ICD-10-CM | POA: Diagnosis not present

## 2022-05-26 DIAGNOSIS — M65341 Trigger finger, right ring finger: Secondary | ICD-10-CM | POA: Diagnosis not present

## 2022-05-26 DIAGNOSIS — M25562 Pain in left knee: Secondary | ICD-10-CM | POA: Diagnosis not present

## 2022-05-26 DIAGNOSIS — G8929 Other chronic pain: Secondary | ICD-10-CM | POA: Diagnosis not present

## 2022-07-22 DIAGNOSIS — C44612 Basal cell carcinoma of skin of right upper limb, including shoulder: Secondary | ICD-10-CM | POA: Diagnosis not present

## 2022-07-28 DIAGNOSIS — H40013 Open angle with borderline findings, low risk, bilateral: Secondary | ICD-10-CM | POA: Diagnosis not present

## 2022-09-01 DIAGNOSIS — C44622 Squamous cell carcinoma of skin of right upper limb, including shoulder: Secondary | ICD-10-CM | POA: Diagnosis not present

## 2022-12-15 DIAGNOSIS — D2361 Other benign neoplasm of skin of right upper limb, including shoulder: Secondary | ICD-10-CM | POA: Diagnosis not present

## 2022-12-15 DIAGNOSIS — C44719 Basal cell carcinoma of skin of left lower limb, including hip: Secondary | ICD-10-CM | POA: Diagnosis not present

## 2022-12-15 DIAGNOSIS — Z85828 Personal history of other malignant neoplasm of skin: Secondary | ICD-10-CM | POA: Diagnosis not present

## 2022-12-15 DIAGNOSIS — L821 Other seborrheic keratosis: Secondary | ICD-10-CM | POA: Diagnosis not present

## 2022-12-15 DIAGNOSIS — G4733 Obstructive sleep apnea (adult) (pediatric): Secondary | ICD-10-CM | POA: Diagnosis not present

## 2022-12-15 DIAGNOSIS — D225 Melanocytic nevi of trunk: Secondary | ICD-10-CM | POA: Diagnosis not present

## 2022-12-15 DIAGNOSIS — C44712 Basal cell carcinoma of skin of right lower limb, including hip: Secondary | ICD-10-CM | POA: Diagnosis not present

## 2023-01-03 IMAGING — MR MR SHOULDER*R* W/O CM
4 of 5 series · 30 of 40 positions shown · non-contrast
Comparison: None.

CLINICAL DATA: Right shoulder pain in weakness for 7 months

EXAM:
MRI OF THE RIGHT SHOULDER WITHOUT CONTRAST
TECHNIQUE: Multiplanar, multisequence MR imaging of the shoulder was performed.
No intravenous contrast was administered.

[Series 6: T2 fat-sat · axial · right · 3.0mm · 0.66mm/px · z∈[-42,+90]mm · 8 of 36 slices shown (1 of 3)]
[im 1/36]
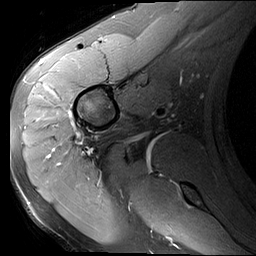
[im 4/36]
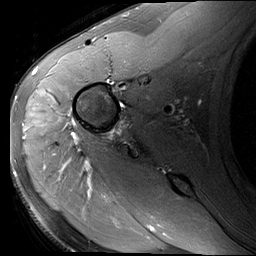
[im 12/36]
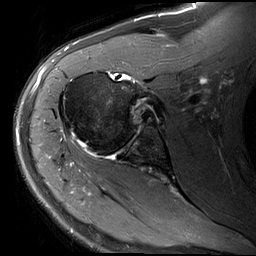
[im 16/36]
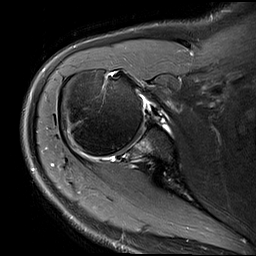
[im 20/36]
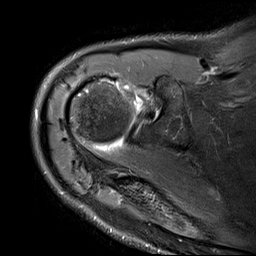
[im 24/36]
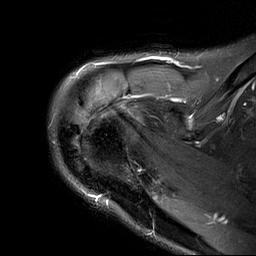
[im 32/36]
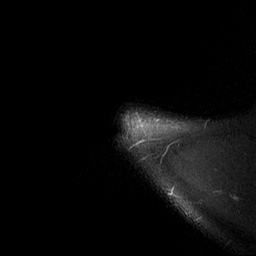
[im 36/36]
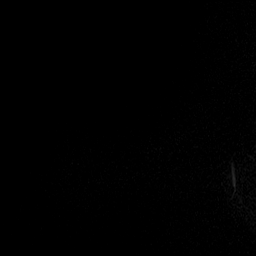

[Series 7: T2 fat-sat · oblique · right · 4.0mm · 0.31mm/px · 7 of 28 slices shown (2 of 3)]
[im 1/28]
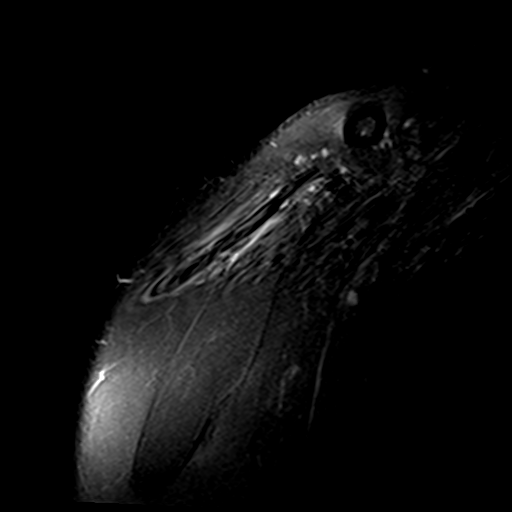
[im 5/28]
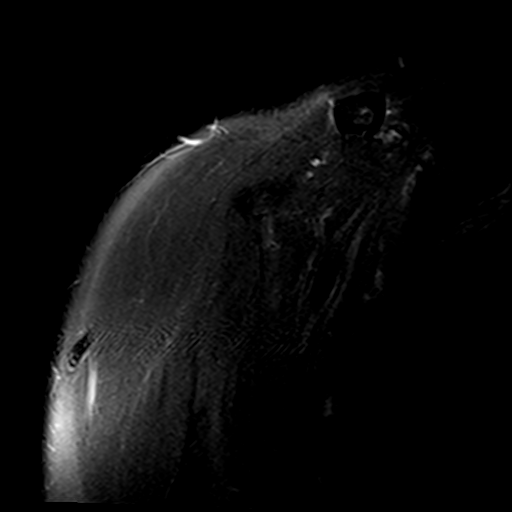
[im 10/28]
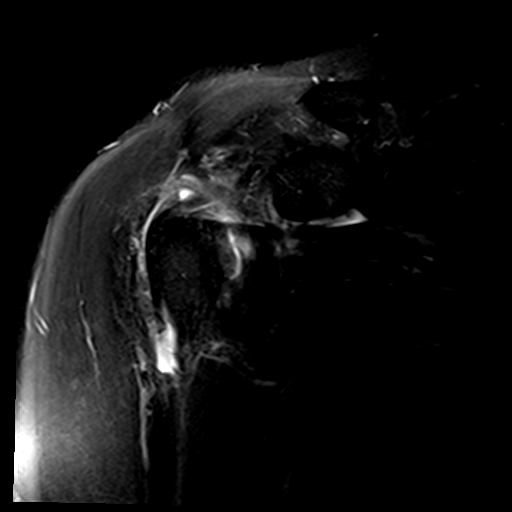
[im 14/28]
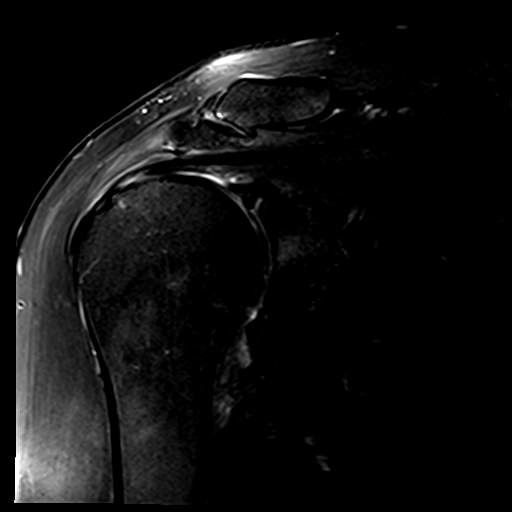
[im 19/28]
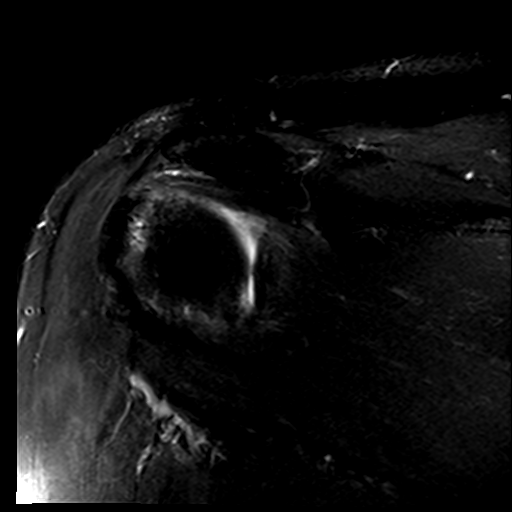
[im 23/28]
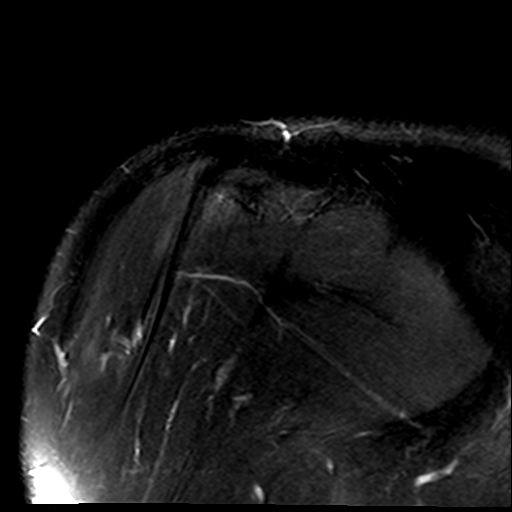
[im 28/28]
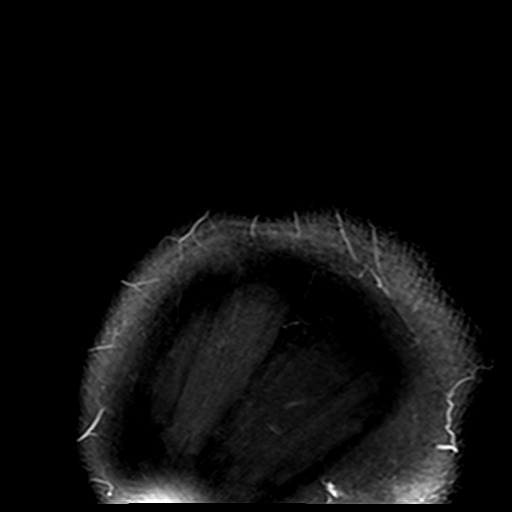

[Series 8: PD · oblique · right · 4.0mm · 0.31mm/px · 7 of 28 slices shown]
[im 1/28]
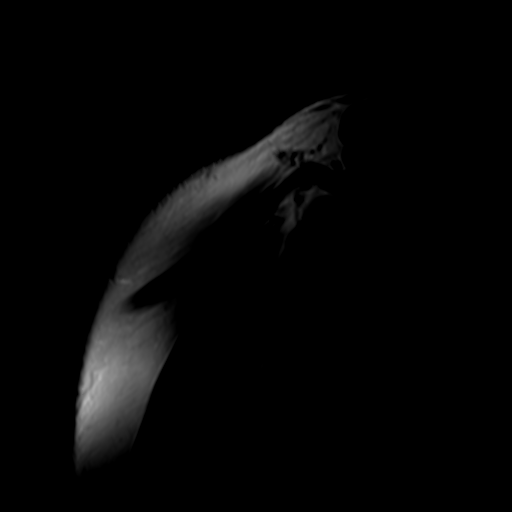
[im 5/28]
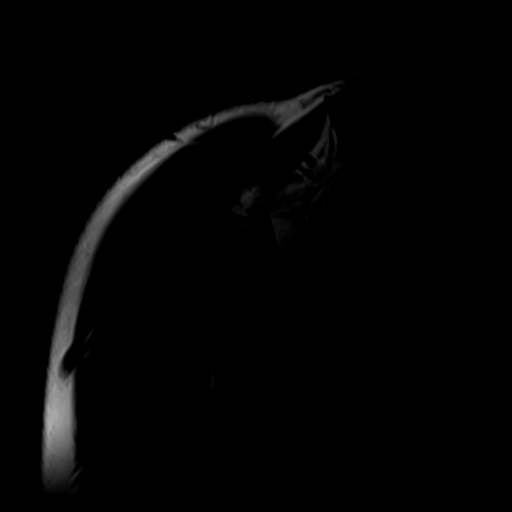
[im 10/28]
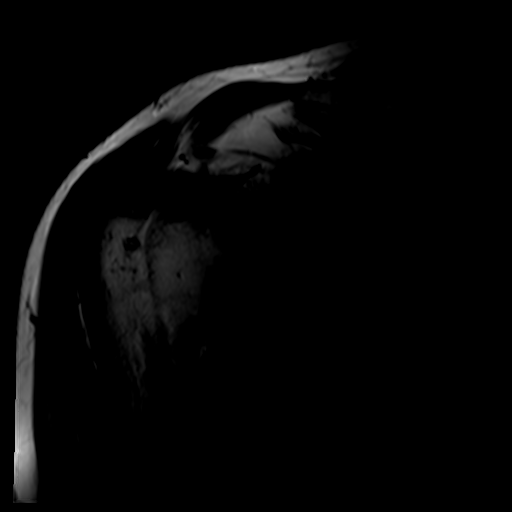
[im 14/28]
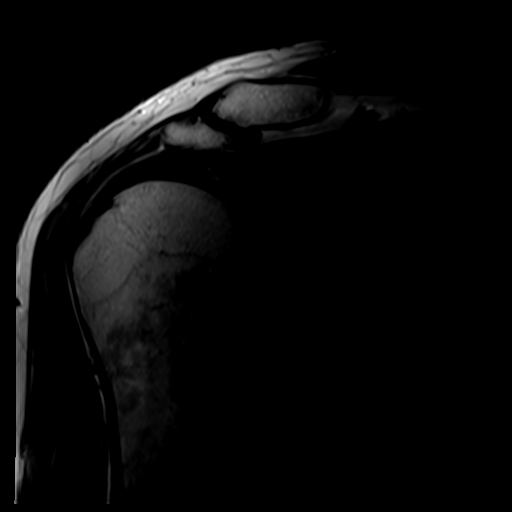
[im 19/28]
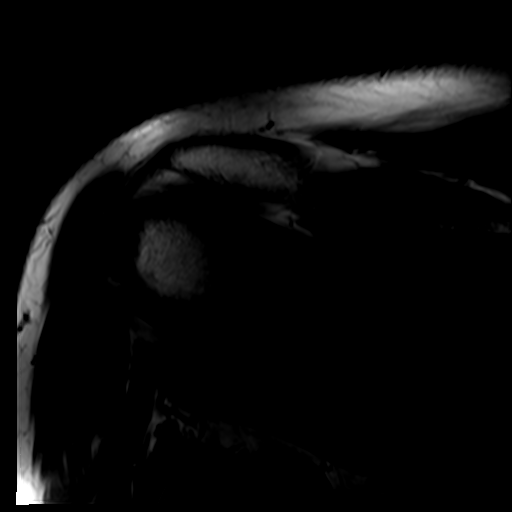
[im 23/28]
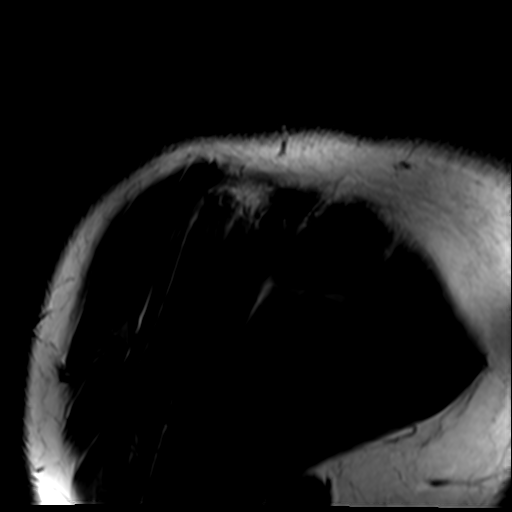
[im 28/28]
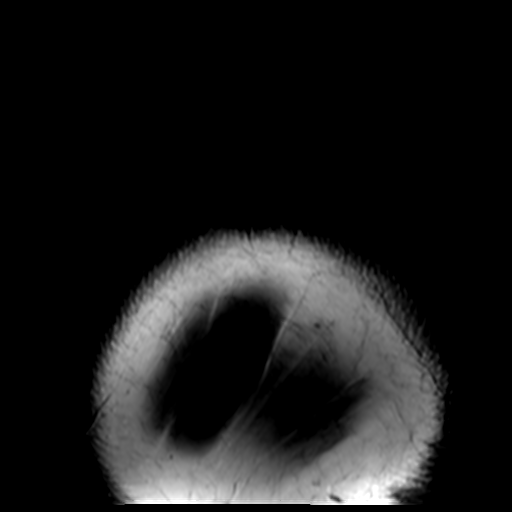

[Series 9: T2 fat-sat · oblique · right · 4.0mm · 0.62mm/px · 8 of 31 slices shown (3 of 3)]
[im 1/31]
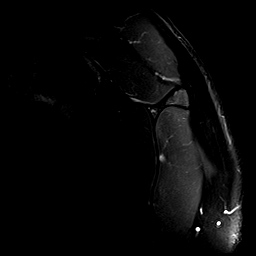
[im 5/31]
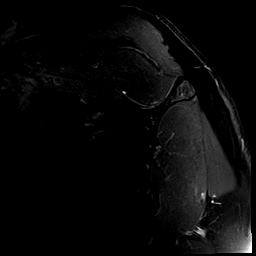
[im 9/31]
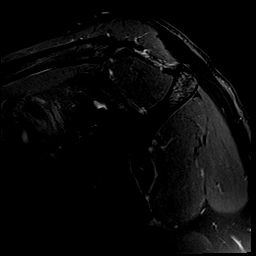
[im 13/31]
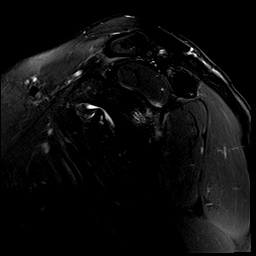
[im 18/31]
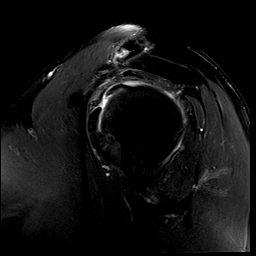
[im 22/31]
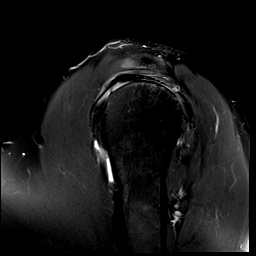
[im 26/31]
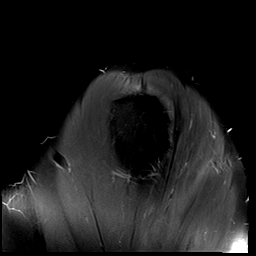
[im 31/31]
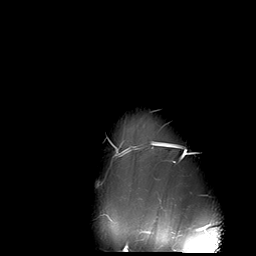

[30 of 40 positions shown; findings below may reference images not displayed]

FINDINGS: Rotator cuff: Mild tendinosis of the supraspinatus and infraspinatus
tendons, both of which are slightly attenuated. Low-grade articular
sided fraying of the distal supraspinatus tendon (series 7, images
15-17). Subscapularis and teres minor tendons intact. No high-grade
or full-thickness rotator cuff tear.

Muscles: Preserved bulk and signal intensity of the rotator cuff
musculature without edema, atrophy, or fatty infiltration.

Biceps long head:  Mild intra-articular biceps tendinosis.

Acromioclavicular Joint: Mild-moderate arthropathy of the AC joint.
No significant subacromial-subdeltoid bursal fluid.

Glenohumeral Joint: Moderate glenohumeral osteoarthritis with
full-thickness cartilage loss of the central and posterior aspects
of the glenoid. Partial-thickness chondral loss of the humeral head.
Trace joint effusion.

Labrum:  Labrum appears diffusely degenerated.  No paralabral cyst.

Bones: Reactive subchondral marrow signal changes and tiny
subchondral cysts within the posterior glenoid. No acute fracture or
dislocation. No suspicious bone lesion.

Other: None.
IMPRESSION: 1. Moderate glenohumeral joint osteoarthritis with full-thickness
cartilage loss of the glenoid.
2. Mild supraspinatus and infraspinatus tendinosis with low-grade
articular sided fraying of the distal supraspinatus tendon. No
high-grade or full-thickness rotator cuff tear.
3. Mild intra-articular biceps tendinosis.
4. Mild-to-moderate AC joint osteoarthritis.

## 2023-01-04 DIAGNOSIS — G4733 Obstructive sleep apnea (adult) (pediatric): Secondary | ICD-10-CM | POA: Diagnosis not present

## 2023-01-04 DIAGNOSIS — R232 Flushing: Secondary | ICD-10-CM | POA: Diagnosis not present

## 2023-01-04 DIAGNOSIS — R6889 Other general symptoms and signs: Secondary | ICD-10-CM | POA: Diagnosis not present

## 2023-01-04 DIAGNOSIS — R0609 Other forms of dyspnea: Secondary | ICD-10-CM | POA: Diagnosis not present

## 2023-01-07 DIAGNOSIS — M7989 Other specified soft tissue disorders: Secondary | ICD-10-CM | POA: Diagnosis not present

## 2023-01-07 DIAGNOSIS — M79604 Pain in right leg: Secondary | ICD-10-CM | POA: Diagnosis not present

## 2023-01-07 DIAGNOSIS — R072 Precordial pain: Secondary | ICD-10-CM | POA: Diagnosis not present

## 2023-01-07 DIAGNOSIS — I447 Left bundle-branch block, unspecified: Secondary | ICD-10-CM | POA: Diagnosis not present

## 2023-01-07 DIAGNOSIS — M79669 Pain in unspecified lower leg: Secondary | ICD-10-CM | POA: Diagnosis not present

## 2023-01-07 DIAGNOSIS — M79605 Pain in left leg: Secondary | ICD-10-CM | POA: Diagnosis not present

## 2023-01-07 DIAGNOSIS — R0601 Orthopnea: Secondary | ICD-10-CM | POA: Diagnosis not present

## 2023-01-07 DIAGNOSIS — R0609 Other forms of dyspnea: Secondary | ICD-10-CM | POA: Diagnosis not present

## 2023-01-11 DIAGNOSIS — R072 Precordial pain: Secondary | ICD-10-CM | POA: Diagnosis not present

## 2023-01-11 DIAGNOSIS — R0609 Other forms of dyspnea: Secondary | ICD-10-CM | POA: Diagnosis not present

## 2023-01-11 DIAGNOSIS — M19011 Primary osteoarthritis, right shoulder: Secondary | ICD-10-CM | POA: Diagnosis not present

## 2023-01-11 DIAGNOSIS — M79604 Pain in right leg: Secondary | ICD-10-CM | POA: Diagnosis not present

## 2023-01-11 DIAGNOSIS — M79669 Pain in unspecified lower leg: Secondary | ICD-10-CM | POA: Diagnosis not present

## 2023-01-11 DIAGNOSIS — R0601 Orthopnea: Secondary | ICD-10-CM | POA: Diagnosis not present

## 2023-01-11 DIAGNOSIS — M79605 Pain in left leg: Secondary | ICD-10-CM | POA: Diagnosis not present

## 2023-01-11 DIAGNOSIS — I447 Left bundle-branch block, unspecified: Secondary | ICD-10-CM | POA: Diagnosis not present

## 2023-01-11 DIAGNOSIS — M7989 Other specified soft tissue disorders: Secondary | ICD-10-CM | POA: Diagnosis not present

## 2023-01-13 DIAGNOSIS — I447 Left bundle-branch block, unspecified: Secondary | ICD-10-CM | POA: Diagnosis not present

## 2023-01-13 DIAGNOSIS — R0601 Orthopnea: Secondary | ICD-10-CM | POA: Diagnosis not present

## 2023-01-13 DIAGNOSIS — R072 Precordial pain: Secondary | ICD-10-CM | POA: Diagnosis not present

## 2023-01-18 DIAGNOSIS — F4323 Adjustment disorder with mixed anxiety and depressed mood: Secondary | ICD-10-CM | POA: Diagnosis not present

## 2023-01-19 DIAGNOSIS — R072 Precordial pain: Secondary | ICD-10-CM | POA: Diagnosis not present

## 2023-01-19 DIAGNOSIS — R232 Flushing: Secondary | ICD-10-CM | POA: Diagnosis not present

## 2023-01-19 DIAGNOSIS — R0609 Other forms of dyspnea: Secondary | ICD-10-CM | POA: Diagnosis not present

## 2023-01-19 DIAGNOSIS — R6889 Other general symptoms and signs: Secondary | ICD-10-CM | POA: Diagnosis not present

## 2023-01-28 DIAGNOSIS — M25511 Pain in right shoulder: Secondary | ICD-10-CM | POA: Diagnosis not present

## 2023-02-01 DIAGNOSIS — F4323 Adjustment disorder with mixed anxiety and depressed mood: Secondary | ICD-10-CM | POA: Diagnosis not present

## 2023-02-08 DIAGNOSIS — M19011 Primary osteoarthritis, right shoulder: Secondary | ICD-10-CM | POA: Diagnosis not present

## 2023-02-08 DIAGNOSIS — M67911 Unspecified disorder of synovium and tendon, right shoulder: Secondary | ICD-10-CM | POA: Diagnosis not present

## 2023-02-15 DIAGNOSIS — R531 Weakness: Secondary | ICD-10-CM | POA: Diagnosis not present

## 2023-02-15 DIAGNOSIS — M75111 Incomplete rotator cuff tear or rupture of right shoulder, not specified as traumatic: Secondary | ICD-10-CM | POA: Diagnosis not present

## 2023-02-15 DIAGNOSIS — F4323 Adjustment disorder with mixed anxiety and depressed mood: Secondary | ICD-10-CM | POA: Diagnosis not present

## 2023-02-15 DIAGNOSIS — M25611 Stiffness of right shoulder, not elsewhere classified: Secondary | ICD-10-CM | POA: Diagnosis not present

## 2023-03-19 DIAGNOSIS — M19011 Primary osteoarthritis, right shoulder: Secondary | ICD-10-CM | POA: Diagnosis not present

## 2023-03-30 DIAGNOSIS — G4733 Obstructive sleep apnea (adult) (pediatric): Secondary | ICD-10-CM | POA: Diagnosis not present

## 2023-05-24 DIAGNOSIS — F4323 Adjustment disorder with mixed anxiety and depressed mood: Secondary | ICD-10-CM | POA: Diagnosis not present

## 2023-05-28 DIAGNOSIS — R972 Elevated prostate specific antigen [PSA]: Secondary | ICD-10-CM | POA: Diagnosis not present

## 2023-05-28 DIAGNOSIS — E785 Hyperlipidemia, unspecified: Secondary | ICD-10-CM | POA: Diagnosis not present

## 2023-05-31 DIAGNOSIS — R972 Elevated prostate specific antigen [PSA]: Secondary | ICD-10-CM | POA: Diagnosis not present

## 2023-06-02 DIAGNOSIS — G4733 Obstructive sleep apnea (adult) (pediatric): Secondary | ICD-10-CM | POA: Diagnosis not present

## 2023-06-02 DIAGNOSIS — Z1331 Encounter for screening for depression: Secondary | ICD-10-CM | POA: Diagnosis not present

## 2023-06-02 DIAGNOSIS — Z1339 Encounter for screening examination for other mental health and behavioral disorders: Secondary | ICD-10-CM | POA: Diagnosis not present

## 2023-06-02 DIAGNOSIS — Z0000 Encounter for general adult medical examination without abnormal findings: Secondary | ICD-10-CM | POA: Diagnosis not present

## 2023-06-07 DIAGNOSIS — F4323 Adjustment disorder with mixed anxiety and depressed mood: Secondary | ICD-10-CM | POA: Diagnosis not present

## 2023-06-12 DIAGNOSIS — R030 Elevated blood-pressure reading, without diagnosis of hypertension: Secondary | ICD-10-CM | POA: Diagnosis not present

## 2023-06-12 DIAGNOSIS — H00012 Hordeolum externum right lower eyelid: Secondary | ICD-10-CM | POA: Diagnosis not present

## 2023-07-12 ENCOUNTER — Other Ambulatory Visit: Payer: Self-pay | Admitting: Urology

## 2023-07-12 DIAGNOSIS — R3129 Other microscopic hematuria: Secondary | ICD-10-CM | POA: Diagnosis not present

## 2023-07-12 DIAGNOSIS — N281 Cyst of kidney, acquired: Secondary | ICD-10-CM | POA: Diagnosis not present

## 2023-07-12 DIAGNOSIS — R972 Elevated prostate specific antigen [PSA]: Secondary | ICD-10-CM

## 2023-07-20 ENCOUNTER — Encounter: Payer: Self-pay | Admitting: Urology

## 2023-07-26 DIAGNOSIS — I10 Essential (primary) hypertension: Secondary | ICD-10-CM | POA: Diagnosis not present

## 2023-07-26 DIAGNOSIS — Z91018 Allergy to other foods: Secondary | ICD-10-CM | POA: Diagnosis not present

## 2023-07-26 DIAGNOSIS — Z7984 Long term (current) use of oral hypoglycemic drugs: Secondary | ICD-10-CM | POA: Diagnosis not present

## 2023-07-26 DIAGNOSIS — Z91040 Latex allergy status: Secondary | ICD-10-CM | POA: Diagnosis not present

## 2023-07-26 DIAGNOSIS — E785 Hyperlipidemia, unspecified: Secondary | ICD-10-CM | POA: Diagnosis not present

## 2023-07-26 DIAGNOSIS — K219 Gastro-esophageal reflux disease without esophagitis: Secondary | ICD-10-CM | POA: Diagnosis not present

## 2023-07-26 DIAGNOSIS — I447 Left bundle-branch block, unspecified: Secondary | ICD-10-CM | POA: Diagnosis not present

## 2023-07-26 DIAGNOSIS — Z8041 Family history of malignant neoplasm of ovary: Secondary | ICD-10-CM | POA: Diagnosis not present

## 2023-07-26 DIAGNOSIS — Z79890 Hormone replacement therapy (postmenopausal): Secondary | ICD-10-CM | POA: Diagnosis not present

## 2023-07-26 DIAGNOSIS — E039 Hypothyroidism, unspecified: Secondary | ICD-10-CM | POA: Diagnosis not present

## 2023-07-26 DIAGNOSIS — E119 Type 2 diabetes mellitus without complications: Secondary | ICD-10-CM | POA: Diagnosis not present

## 2023-07-26 DIAGNOSIS — Z79899 Other long term (current) drug therapy: Secondary | ICD-10-CM | POA: Diagnosis not present

## 2023-07-26 DIAGNOSIS — R3129 Other microscopic hematuria: Secondary | ICD-10-CM | POA: Diagnosis not present

## 2023-07-26 DIAGNOSIS — I5181 Takotsubo syndrome: Secondary | ICD-10-CM | POA: Diagnosis not present

## 2023-07-26 DIAGNOSIS — Z87891 Personal history of nicotine dependence: Secondary | ICD-10-CM | POA: Diagnosis not present

## 2023-07-26 DIAGNOSIS — I2510 Atherosclerotic heart disease of native coronary artery without angina pectoris: Secondary | ICD-10-CM | POA: Diagnosis not present

## 2023-07-26 DIAGNOSIS — Z90710 Acquired absence of both cervix and uterus: Secondary | ICD-10-CM | POA: Diagnosis not present

## 2023-07-26 DIAGNOSIS — Z7952 Long term (current) use of systemic steroids: Secondary | ICD-10-CM | POA: Diagnosis not present

## 2023-07-26 DIAGNOSIS — I214 Non-ST elevation (NSTEMI) myocardial infarction: Secondary | ICD-10-CM | POA: Diagnosis not present

## 2023-07-26 DIAGNOSIS — Z8249 Family history of ischemic heart disease and other diseases of the circulatory system: Secondary | ICD-10-CM | POA: Diagnosis not present

## 2023-07-26 DIAGNOSIS — F419 Anxiety disorder, unspecified: Secondary | ICD-10-CM | POA: Diagnosis not present

## 2023-07-26 DIAGNOSIS — Z86008 Personal history of in-situ neoplasm of other site: Secondary | ICD-10-CM | POA: Diagnosis not present

## 2023-07-26 DIAGNOSIS — Z801 Family history of malignant neoplasm of trachea, bronchus and lung: Secondary | ICD-10-CM | POA: Diagnosis not present

## 2023-07-26 DIAGNOSIS — E7800 Pure hypercholesterolemia, unspecified: Secondary | ICD-10-CM | POA: Diagnosis not present

## 2023-07-28 DIAGNOSIS — R3129 Other microscopic hematuria: Secondary | ICD-10-CM | POA: Diagnosis not present

## 2023-08-04 DIAGNOSIS — R3129 Other microscopic hematuria: Secondary | ICD-10-CM | POA: Diagnosis not present

## 2023-08-04 DIAGNOSIS — N281 Cyst of kidney, acquired: Secondary | ICD-10-CM | POA: Diagnosis not present

## 2023-08-18 ENCOUNTER — Ambulatory Visit
Admission: RE | Admit: 2023-08-18 | Discharge: 2023-08-18 | Disposition: A | Discharge status: Home / Self Care | Source: Ambulatory Visit | Attending: Urology

## 2023-08-18 DIAGNOSIS — R972 Elevated prostate specific antigen [PSA]: Secondary | ICD-10-CM | POA: Diagnosis not present

## 2023-08-18 MED ORDER — GADOPICLENOL 0.5 MMOL/ML IV SOLN
10.0000 mL | Freq: Once | INTRAVENOUS | Status: AC | PRN
  Administered 2023-08-18: 10 mL via INTRAVENOUS

## 2023-08-31 DIAGNOSIS — I252 Old myocardial infarction: Secondary | ICD-10-CM | POA: Diagnosis not present

## 2023-09-06 DIAGNOSIS — I252 Old myocardial infarction: Secondary | ICD-10-CM | POA: Diagnosis not present

## 2023-09-08 DIAGNOSIS — I252 Old myocardial infarction: Secondary | ICD-10-CM | POA: Diagnosis not present

## 2023-09-13 DIAGNOSIS — I252 Old myocardial infarction: Secondary | ICD-10-CM | POA: Diagnosis not present

## 2023-09-23 DIAGNOSIS — C61 Malignant neoplasm of prostate: Secondary | ICD-10-CM | POA: Diagnosis not present

## 2023-09-23 DIAGNOSIS — N411 Chronic prostatitis: Secondary | ICD-10-CM | POA: Diagnosis not present

## 2023-09-23 DIAGNOSIS — R972 Elevated prostate specific antigen [PSA]: Secondary | ICD-10-CM | POA: Diagnosis not present

## 2023-10-07 DIAGNOSIS — C61 Malignant neoplasm of prostate: Secondary | ICD-10-CM | POA: Diagnosis not present

## 2023-10-08 ENCOUNTER — Inpatient Hospital Stay
Admission: RE | Admit: 2023-10-08 | Discharge: 2023-10-08 | Disposition: A | Discharge status: Home / Self Care | Payer: Self-pay | Source: Ambulatory Visit | Attending: Radiation Oncology | Admitting: Radiation Oncology

## 2023-10-08 ENCOUNTER — Other Ambulatory Visit: Payer: Self-pay | Admitting: Radiation Oncology

## 2023-10-08 ENCOUNTER — Telehealth: Payer: Self-pay | Admitting: Radiation Oncology

## 2023-10-08 DIAGNOSIS — C61 Malignant neoplasm of prostate: Secondary | ICD-10-CM

## 2023-10-08 NOTE — Telephone Encounter (Signed)
 Left message for patient to call back to schedule consult per 7/24 referral.

## 2023-10-11 ENCOUNTER — Telehealth: Payer: Self-pay | Admitting: Radiation Oncology

## 2023-10-11 NOTE — Telephone Encounter (Signed)
 Left message for patient to call back to schedule consult per 7/24 referral.

## 2023-10-13 ENCOUNTER — Telehealth: Payer: Self-pay | Admitting: Radiation Oncology

## 2023-10-13 NOTE — Telephone Encounter (Signed)
 Left message for patient to call back to schedule consult per 7/24 referral.

## 2023-10-14 ENCOUNTER — Telehealth: Payer: Self-pay | Admitting: Radiation Oncology

## 2023-10-14 NOTE — Telephone Encounter (Signed)
 Mailed letter for patient to call back to schedule consult per 7/24 referral.

## 2023-10-24 DIAGNOSIS — C61 Malignant neoplasm of prostate: Secondary | ICD-10-CM | POA: Diagnosis not present

## 2023-12-10 DIAGNOSIS — G4733 Obstructive sleep apnea (adult) (pediatric): Secondary | ICD-10-CM | POA: Diagnosis not present

## 2024-04-12 ENCOUNTER — Other Ambulatory Visit: Payer: Self-pay | Admitting: Urology

## 2024-04-12 DIAGNOSIS — C61 Malignant neoplasm of prostate: Secondary | ICD-10-CM
